# Patient Record
Sex: Male | Born: 2000 | Race: Black or African American | Hispanic: No | Marital: Single | Smoking: Never smoker
Health system: Southern US, Community
[De-identification: ages and names within clinical notes are randomized; demographics above are authoritative.]

## PROBLEM LIST (undated history)

## (undated) ENCOUNTER — Emergency Department (HOSPITAL_COMMUNITY): Discharge status: Against Medical Advice | Payer: 59

---
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
## 2020-05-14 ENCOUNTER — Ambulatory Visit (HOSPITAL_COMMUNITY)
Admission: EM | Admit: 2020-05-14 | Discharge: 2020-05-14 | Disposition: A | Discharge status: Home / Self Care | Payer: 59 | Attending: Family Medicine | Admitting: Family Medicine

## 2020-05-14 ENCOUNTER — Other Ambulatory Visit: Payer: Self-pay

## 2020-05-14 ENCOUNTER — Encounter (HOSPITAL_COMMUNITY): Payer: Self-pay | Admitting: *Deleted

## 2020-05-14 ENCOUNTER — Ambulatory Visit (INDEPENDENT_AMBULATORY_CARE_PROVIDER_SITE_OTHER): Payer: 59

## 2020-05-14 DIAGNOSIS — Y9367 Activity, basketball: Secondary | ICD-10-CM | POA: Diagnosis not present

## 2020-05-14 DIAGNOSIS — S93492A Sprain of other ligament of left ankle, initial encounter: Secondary | ICD-10-CM

## 2020-05-14 MED ORDER — IBUPROFEN 800 MG PO TABS: 800.0000 mg | ORAL_TABLET | Freq: Three times a day (TID) | ORAL | 0 refills | Status: AC

## 2020-05-14 NOTE — ED Provider Notes (Signed)
[facility]    CSN: [id number] Arrival date & time: 05/14/20  1330      History   Chief Complaint Chief Complaint  Patient presents with  . Ankle Injury    HPI Kenneth Stevens is a 19 y.o. male.   HPI   Twisted ankle playing basketball today Cannot bear weight Pain and swelling outside of ankle Never had fracture   History reviewed. No pertinent past medical history.  There are no problems to display for this patient.   History reviewed. No pertinent surgical history.     Home Medications    Prior to Admission medications   Medication Sig Start Date End Date Taking? Authorizing Provider  ibuprofen (ADVIL) 800 MG tablet Take 1 tablet (800 mg total) by mouth 3 (three) times daily. 05/14/20   Eustace Moore, MD  cetirizine (ZYRTEC) 10 MG tablet Take 1 tablet (10 mg total) by mouth daily. 12/28/12 05/14/20  Laurell Josephs, MD    Family History Family History  Problem Relation Age of Onset  . Anemia Mother     Social History Social History   Tobacco Use  . Smoking status: Never Smoker  . Smokeless tobacco: Never Used  Vaping Use  . Vaping Use: Every day  Substance Use Topics  . Alcohol use: No  . Drug use: Yes    Types: Marijuana    Comment: daily     Allergies   Patient has no known allergies.   Review of Systems Review of Systems See HPI  Physical Exam Triage Vital Signs ED Triage Vitals [05/14/20 1553]  Enc Vitals Group     BP 130/61     Pulse Rate 65     Resp 16     Temp 98.9 F (37.2 C)     Temp Source Oral     SpO2 100 %     Weight      Height      Head Circumference      Peak Flow      Pain Score 5     Pain Loc      Pain Edu?      Excl. in GC?    No data found.  Updated Vital Signs BP 130/61 (BP Location: Right Arm)   Pulse 65   Temp 98.9 F (37.2 C) (Oral)   Resp 16   SpO2 100%      Physical Exam Constitutional:      General: He is not in acute distress.    Appearance: He is well-developed.    HENT:     Head: Normocephalic and atraumatic.     Mouth/Throat:     Comments: Mask in place Eyes:     Conjunctiva/sclera: Conjunctivae normal.     Pupils: Pupils are equal, round, and reactive to light.  Cardiovascular:     Rate and Rhythm: Normal rate.  Pulmonary:     Effort: Pulmonary effort is normal. No respiratory distress.  Abdominal:     Palpations: Abdomen is soft.  Musculoskeletal:     Cervical back: Normal range of motion.     Left ankle: Swelling and ecchymosis present. No deformity or lacerations. Tenderness present over the lateral malleolus. No medial malleolus, base of 5th metatarsal or proximal fibula tenderness. Decreased range of motion. Anterior drawer test negative.     Left Achilles Tendon: Normal. No tenderness.  Skin:    General: Skin is warm and dry.  Neurological:     Mental Status: He is alert.  Gait: Gait abnormal.  Psychiatric:        Mood and Affect: Mood normal.        Behavior: Behavior normal.      UC Treatments / Results  Labs (all labs ordered are listed, but only abnormal results are displayed) Labs Reviewed - No data to display  EKG   Radiology DG Ankle Complete Left  Result Date: 05/14/2020 CLINICAL DATA:  Lateral ankle pain following basketball injury, initial encounter EXAM: LEFT ANKLE COMPLETE - 3+ VIEW COMPARISON:  None. FINDINGS: Lateral soft tissue swelling is noted. No acute fracture or dislocation is seen. IMPRESSION: Lateral soft tissue swelling without acute bony abnormality. Electronically Signed   By: Alcide Clever M.D.   On: 05/14/2020 16:35    Procedures Procedures (including critical care time)  Medications Ordered in UC Medications - No data to display  Initial Impression / Assessment and Plan / UC Course  I have reviewed the triage vital signs and the nursing notes.  Pertinent labs & imaging results that were available during my care of the patient were reviewed by me and considered in my medical decision  making (see chart for details).    Final Clinical Impressions(s) / UC Diagnoses   Final diagnoses:  Sprain of anterior talofibular ligament of left ankle, initial encounter     Discharge Instructions     Use ice for 20 minutes every couple of hours Limit walking well ankle is painful Wear brace until you can walk without a limp Brace is designed to be worn inside a lace up shoe Take ibuprofen 3 times a day with food as needed for pain Call sports medicine if you fail to improve as expected   ED Prescriptions    Medication Sig Dispense Auth. Provider   ibuprofen (ADVIL) 800 MG tablet Take 1 tablet (800 mg total) by mouth 3 (three) times daily. 21 tablet Eustace Moore, MD     PDMP not reviewed this encounter.   Eustace Moore, MD 05/14/20 [phone]

## 2020-05-14 NOTE — ED Triage Notes (Signed)
Patient in with complaints of left ankle injury/ pain that occurred today while playing basketball. Patient unable to bear weight on foot.Decreased ROM and swelling noted to left ankle.2- Ibuprofen taken around 1520.

## 2020-05-14 NOTE — Discharge Instructions (Signed)
Use ice for 20 minutes every couple of hours Limit walking well ankle is painful Wear brace until you can walk without a limp Brace is designed to be worn inside a lace up shoe Take ibuprofen 3 times a day with food as needed for pain Call sports medicine if you fail to improve as expected

## 2020-05-23 ENCOUNTER — Other Ambulatory Visit: Payer: Self-pay

## 2020-05-23 ENCOUNTER — Ambulatory Visit (INDEPENDENT_AMBULATORY_CARE_PROVIDER_SITE_OTHER): Payer: 59 | Admitting: Family Medicine

## 2020-05-23 DIAGNOSIS — S93409A Sprain of unspecified ligament of unspecified ankle, initial encounter: Secondary | ICD-10-CM | POA: Insufficient documentation

## 2020-05-23 DIAGNOSIS — S93492A Sprain of other ligament of left ankle, initial encounter: Secondary | ICD-10-CM

## 2020-05-23 NOTE — Assessment & Plan Note (Addendum)
Given history, clinical presentation, and exam today and previous x-ray results reviewed by myself today he has a grade 2 ankle sprain.  Given that he has continued to have pain in the ASO brace and needed the crutches I will place him in a short boot for 1 week so he can get around and do his job more easily without needing the crutches.  After 1 week I did instruct patient to transition back into the ASO brace.  I do want him coming out of the boot to do the range of motion exercises that we gave to him today.  Over the next week I want him to begin the strengthening exercises and do those for 1 week and always wear the ASO brace when active. -Continue ibuprofen 800 mg as needed for pain -Continue in the short boot for 1 week then transition to ASO brace -Begin range of motion exercises for 1 week then transition to strengthening exercises for 1 week -Follow-up in 2 weeks -Gave patient 2-week limit restriction on picking up items over 25 pounds and also do not want him bending down to pick up anything below the waist.  After 2 weeks he should be well-healed enough for these restrictions to be lifted.

## 2020-05-23 NOTE — Progress Notes (Addendum)
    SUBJECTIVE:   CHIEF COMPLAINT / HPI:   Left ankle pain [name] is a very pleasant 19 year old male who presents as a new patient to this clinic for follow-up due to lateral ankle pain.  He states about 9 days ago he was playing basketball and felt his ankle roll (inversion) and the top of his foot touch the ground.  He had immediate pain and swelling and was unable to bear weight.  He was seen at [facility] urgent care where he got x-rays which were negative for fracture and was treated for a ankle sprain and given an ASO brace and crutches.  He states since that time his swelling and pain has improved a good deal however he still has considerable amount of pain if he tries to bear weight without the crutches.  He has been taking the 800 mg ibuprofen and that is helping.  PERTINENT  PMH / PSH: None  OBJECTIVE:   BP 122/70   Ht 5\' 11"  (1.803 m)   Wt 165 lb (74.8 kg)   BMI 23.01 kg/m   No flowsheet data found. Ankle/Foot, Left: No visible erythema, mild swelling, no ecchymosis or bony deformity. He has mild swelling at the lateral malleolus and TTP below the lateral malleolus and just distal to the medial malleolus. No notable pes planus deformity. Transverse arch grossly intact; Range of motion is somewhat limited in all directions. Strength is 5/5 in all directions. Unremarkable squeeze test; Able to walk 4 steps but painful.  Special Tests:   - Anterior Drawer test: Positive   - Talar Tilt test: Equivocal   - Syndesmotic Squeeze test: NEG   ASSESSMENT/PLAN:   Sprain of ankle Given history, clinical presentation, and exam today and previous x-ray results reviewed by myself today he has a grade 2 ankle sprain.  Given that he has continued to have pain in the ASO brace and needed the crutches I will place him in a short boot for 1 week so he can get around and do his job more easily without needing the crutches.  After 1 week I did instruct patient to transition back into the ASO  brace.  I do want him coming out of the boot to do the range of motion exercises that we gave to him today.  Over the next week I want him to begin the strengthening exercises and do those for 1 week and always wear the ASO brace when active. -Continue ibuprofen 800 mg as needed for pain -Continue in the short boot for 1 week then transition to ASO brace -Begin range of motion exercises for 1 week then transition to strengthening exercises for 1 week -Follow-up in 2 weeks -Gave patient 2-week limit restriction on picking up items over 25 pounds and also do not want him bending down to pick up anything below the waist.  After 2 weeks he should be well-healed enough for these restrictions to be lifted.    , DO PGY-4, Sports Medicine Fellow [facility]  Addendum:  Patient seen in the office by fellow.  His history, exam, plan of care were precepted with me.  [facility] MD Norton Blizzard

## 2020-05-23 NOTE — Patient Instructions (Signed)
It was great to meet you today! Thank you for letting me participate in your care!  Today, we discussed you have a lateral ankle sprain. I want you to wear the boot for one week only, then go back to the ankle brace. Come out of the boot during the day to do the range of motion exercises over the next week. Don't wear the boot at night. After one week, star the strengthening ankle exercises and then follow up with [place] in clinic.  Be well, Jules Schick, DO PGY-4, Sports Medicine Fellow [facility]

## 2020-05-24 ENCOUNTER — Encounter: Payer: Self-pay | Admitting: Family Medicine

## 2020-06-03 ENCOUNTER — Other Ambulatory Visit: Payer: Self-pay

## 2020-06-03 ENCOUNTER — Emergency Department (HOSPITAL_COMMUNITY)
Admission: EM | Admit: 2020-06-03 | Discharge: 2020-06-04 | Disposition: A | Discharge status: Home / Self Care | Payer: 59 | Attending: Emergency Medicine | Admitting: Emergency Medicine

## 2020-06-03 ENCOUNTER — Encounter (HOSPITAL_COMMUNITY): Payer: Self-pay | Admitting: Emergency Medicine

## 2020-06-03 DIAGNOSIS — R1110 Vomiting, unspecified: Secondary | ICD-10-CM | POA: Diagnosis not present

## 2020-06-03 DIAGNOSIS — Y33XXXA Other specified events, undetermined intent, initial encounter: Secondary | ICD-10-CM | POA: Insufficient documentation

## 2020-06-03 DIAGNOSIS — Z03821 Encounter for observation for suspected ingested foreign body ruled out: Secondary | ICD-10-CM | POA: Diagnosis not present

## 2020-06-03 DIAGNOSIS — T189XXA Foreign body of alimentary tract, part unspecified, initial encounter: Secondary | ICD-10-CM

## 2020-06-03 DIAGNOSIS — R059 Cough, unspecified: Secondary | ICD-10-CM | POA: Diagnosis not present

## 2020-06-03 NOTE — ED Triage Notes (Signed)
Pt was smoking some marijuana and got the munchies. States that he was eating some [employer] and accidentally ate some of the plastic wrap and states that it is stuck in his throat. Tried to eat some bread to push it down, but it still feels like its there.

## 2020-06-04 ENCOUNTER — Other Ambulatory Visit: Payer: Self-pay

## 2020-06-04 NOTE — Discharge Instructions (Signed)
Thank you for allowing me to care for you today in the Emergency Department.   As we discussed, since he did not have any episodes of coughing, vomiting, or choking at the time when you swallowed the piece of plastic wrapper, it is extremely unlikely that the plastic wrapper was aspirated into your lungs.  As we discussed, this piece of plastic should travel through your digestive tract.  It should come out on its own.  There is no treatment for this at this time.  I would avoid trying to make yourself vomit as this may just make you feel unwell.  There is an extremely small chance that the piece of rubber could get stuck, but this is very unlikely.  However, if you were to develop severe, uncontrollable pain, if your abdomen became hard and swollen, if you stop passing gas, if you develop uncontrollable vomiting, or fevers, you should return to the emergency department for reevaluation.

## 2020-06-04 NOTE — ED Provider Notes (Signed)
[facility]-EMERGENCY DEPT Provider Note   CSN: [id number] Arrival date & time: 06/03/20  2157     History No chief complaint on file.   Kenneth Stevens is a 19 y.o. male who presents to the emergency department with a chief complaint of foreign body ingestion.  The patient was smoking marijuana earlier tonight and became hungry so he unwrapped a little Debbie snack cake.  He then unintentionally swallowed a small amount of the plastic wrapper along with the snack.  He states that he felt it was stuck in his throat.  He called his mom who told him to try and eat some bread to push it down.  There was some initial improvement, but he still feels as if he has a foreign body in his throat.  He did not try to cough really hard several times to the point that he ended up retching and vomiting approximately 5 times.  He has no nausea and has had no vomiting other than associated with the coughing episode.  He denies abdominal pain, cough, shortness of breath, dysphagia, sore throat, diarrhea, constipation.  He is passing flatus.  He has no other complaints at this time.  The history is provided by the patient and medical records. No language interpreter was used.       History reviewed. No pertinent past medical history.  Patient Active Problem List   Diagnosis Date Noted  . Sprain of ankle 05/23/2020    History reviewed. No pertinent surgical history.     Family History  Problem Relation Age of Onset  . Anemia Mother     Social History   Tobacco Use  . Smoking status: Never Smoker  . Smokeless tobacco: Never Used  Vaping Use  . Vaping Use: Every day  Substance Use Topics  . Alcohol use: No  . Drug use: Yes    Types: Marijuana    Comment: daily    Home Medications Prior to Admission medications   Medication Sig Start Date End Date Taking? Authorizing Provider  ibuprofen (ADVIL) 800 MG tablet Take 1 tablet (800 mg total) by mouth 3 (three) times daily.  05/14/20   Eustace Moore, MD  cetirizine (ZYRTEC) 10 MG tablet Take 1 tablet (10 mg total) by mouth daily. 12/28/12 05/14/20  Laurell Josephs, MD    Allergies    Patient has no known allergies.  Review of Systems   Review of Systems  Constitutional: Negative for appetite change, chills, diaphoresis and fever.  HENT: Positive for sore throat and trouble swallowing. Negative for congestion, sinus pressure, sinus pain and voice change.   Respiratory: Positive for cough. Negative for shortness of breath and wheezing.   Cardiovascular: Negative for chest pain and palpitations.  Gastrointestinal: Positive for abdominal pain, nausea and vomiting. Negative for constipation and diarrhea.  Genitourinary: Negative for dysuria.  Musculoskeletal: Negative for back pain, myalgias, neck pain and neck stiffness.  Skin: Negative for rash.  Allergic/Immunologic: Negative for immunocompromised state.  Neurological: Negative for dizziness, seizures, syncope, weakness, numbness and headaches.  Psychiatric/Behavioral: Negative for confusion.    Physical Exam Updated Vital Signs BP 102/77   Pulse (!) 58   Temp 99.6 F (37.6 C) (Oral)   Resp 17   SpO2 97%   Physical Exam Vitals and nursing note reviewed.  Constitutional:      General: He is not in acute distress.    Appearance: He is well-developed. He is not ill-appearing, toxic-appearing or diaphoretic.  HENT:  Head: Normocephalic.     Mouth/Throat:     Mouth: Mucous membranes are moist.     Pharynx: No oropharyngeal exudate or posterior oropharyngeal erythema.     Comments: Posterior oropharynx is patent.  Uvula is midline.  He is tolerating secretions without difficulty.  There is no trismus or drooling.  Normal phonation. Eyes:     Conjunctiva/sclera: Conjunctivae normal.  Cardiovascular:     Rate and Rhythm: Normal rate and regular rhythm.     Pulses: Normal pulses.     Heart sounds: Normal heart sounds. No murmur heard.  No  friction rub. No gallop.   Pulmonary:     Effort: Pulmonary effort is normal. No respiratory distress.     Breath sounds: No stridor. No wheezing, rhonchi or rales.  Chest:     Chest wall: No tenderness.  Abdominal:     General: There is no distension.     Palpations: Abdomen is soft.     Comments: Abdomen soft, nontender, nondistended.  Musculoskeletal:     Cervical back: Normal range of motion and neck supple. No rigidity or tenderness.     Right lower leg: No edema.     Left lower leg: No edema.  Skin:    General: Skin is warm and dry.  Neurological:     Mental Status: He is alert.  Psychiatric:        Behavior: Behavior normal.     ED Results / Procedures / Treatments   Labs (all labs ordered are listed, but only abnormal results are displayed) Labs Reviewed - No data to display  EKG None  Radiology No results found.  Procedures Procedures (including critical care time)  Medications Ordered in ED Medications - No data to display  ED Course  I have reviewed the triage vital signs and the nursing notes.  Pertinent labs & imaging results that were available during my care of the patient were reviewed by me and considered in my medical decision making (see chart for details).    MDM Rules/Calculators/A&P                          19 year old male with no significant past medical history who presents the emergency department with unintentional foreign body ingestion.  He swallowed a portion of a plastic wrapper on a snack cake earlier tonight.  He attempted to treat his symptoms by coughing so hard that he had 5 episodes of posttussive emesis.  He attempted swallowing some bread and drinking fluids.  He reports that his pain is resolved.  He no longer has globus sensation.  He is tolerating his secretions without difficulty.  Posterior oropharynx is patent.  Lungs are clear to auscultation bilaterally.  His abdomen is benign.  I have a very low suspicion that he  aspirated a portion of the plastic wrap given that he did not have any associated coughing, vomiting, or choking episodes at time of ingestion.  The patient is also not appear to have an esophageal obstruction at this time as all of his dysphagia, globus sensation, and sore throat have completely resolved.  I discussed with the patient that he will most likely passed the piece of plastic independently.  I also discussed that there is a very small chance that he could develop a bowel obstruction.  Signs and symptoms to return to the ER were also discussed.  All questions were answered.  He is hemodynamically stable to no acute distress.  Safe for discharge home with outpatient follow-up as indicated.  Final Clinical Impression(s) / ED Diagnoses Final diagnoses:  Swallowed foreign body, initial encounter    Rx / DC Orders ED Discharge Orders    None       Barkley Boards, PA-C 06/04/20 0134    Molpus, Jonny Ruiz, MD 06/04/20 [phone]

## 2020-06-06 ENCOUNTER — Ambulatory Visit (INDEPENDENT_AMBULATORY_CARE_PROVIDER_SITE_OTHER): Payer: 59 | Admitting: Family Medicine

## 2020-06-06 ENCOUNTER — Other Ambulatory Visit: Payer: Self-pay

## 2020-06-06 DIAGNOSIS — S93402D Sprain of unspecified ligament of left ankle, subsequent encounter: Secondary | ICD-10-CM

## 2020-06-06 NOTE — Progress Notes (Addendum)
    SUBJECTIVE:   CHIEF COMPLAINT / HPI:   Left ankle pain, follow-up Patient is a very pleasant 19 year old male presenting for follow-up with left ankle pain.  He rolled his ankle while playing basketball approximately 3-4 weeks ago sustaining inversion injury and was seen on the 20th for this.  Patient was also seen at the [facility] urgent care and got x-rays which were negative for fracture and he was treated for an ankle sprain.  Today patient states that his ankle pain is improved by about 70% since his previous visit.  He states that he continues to wear the ASO brace every day as he is on his feet constantly throughout the day and does ankle mobility exercises about once every other day.  He states that his swelling has improved but he still has a small amount of swelling compared to the other ankle.  He states that he feels stability in the ankle when he is walking but that sometimes he does have pain on the takeoff with steps in the area where his swelling remains, on the anterior medial aspect of the left ankle.  PERTINENT  PMH / PSH: None  OBJECTIVE:   BP 110/68   Ht 5\' 11"  (1.803 m)   Wt 170 lb (77.1 kg)   BMI 23.71 kg/m    Ankle/Foot, left: No visible erythema, only mild swelling compared to the right, ecchymosis, or bony deformity.  He does have some mild discomfort to the anterior lateral aspect of the left ankle with palpation.  No notable pes planus deformity. Transverse arch grossly intact; Range of motion is full in all directions. Strength is 5/5 in all directions.  On mild discomfort when strength testing is performed on the medial and lateral aspect. Mild + ant drawer.  ASSESSMENT/PLAN:   Sprain of ankle Assessment: 19 year old male following up for left ankle sprain which appears to be healing well at this point.  Patient endorses approximately 70% improvement in his symptoms.  Still endorses some mild swelling in the left ankle versus the right and continues to wear his  ASO brace while on his feet throughout the day, but he states that his pain has significantly improved.  Physical exam overall reassuring with good mobility and strength testing in all directions.  Overall healing at an appropriate interval. Plan: -Recommend he continue using the ASO brace while on his feet -Discussed that he can use ibuprofen as needed if he has time where he is on his feet longer than usual and gets pain -Patient plans to continue the band strengthening exercises on a daily basis as well as the mobility exercises for his ankle -Patient plans to follow-up as needed, such as if he develops a plateau in his healing or has worsening symptoms/pain.   24, DO PGY-2, [facility] Resident [facility]  [facility] Sports Medicine Fellow  Addendum:  Patient seen in the office by fellow and resident.  Their history, exam, plan of care were precepted with me.  Jules Schick MD [facility]

## 2020-06-06 NOTE — Assessment & Plan Note (Addendum)
Assessment: 19 year old male following up for left ankle sprain which appears to be healing well at this point.  Patient endorses approximately 70% improvement in his standards.  Still endorses some mild swelling in the left ankle versus the right and continues to wear his ASO brace while on his feet throughout the day, but he states that his pain has significantly improved.  Physical exam overall reassuring with good mobility and strength testing in all directions.  Overall healing at an appropriate interval. Plan: -Recommend he continue using the ASO brace while on his feet -Discussed that he can use ibuprofen as needed if he has time where he is on his feet longer than usual and gets pain -Patient plans to continue the band strengthening exercises on a daily basis as well as the mobility exercises for his ankle -Patient plans to follow-up as needed, such as if he develops a plateau in his healing or has worsening symptoms/pain.

## 2020-06-06 NOTE — Patient Instructions (Signed)
It was great to see you!  Our plans for today:  -Today we discussed your ankle sprain.  I am pleased with the improvement you have had at this point. -I would like for you to continue wearing your ASO brace while you are on your feet -I would like for you to continue with band work to build strengthen the muscles around your ankle -I would like for you to continue with the A, B, C mobility exercises at least once per day  -If needed you can do ibuprofen for pain on days where you are on your feet more than usual. -Follow-up with [place] as needed  Take care and seek immediate care sooner if you develop any concerns.   Dr. [facility]

## 2022-04-29 IMAGING — DX DG ANKLE COMPLETE 3+V*L*
3 series · 3 of 3 positions shown · non-contrast
Comparison: None.

CLINICAL DATA: Lateral ankle pain following basketball injury,
initial encounter

EXAM:
LEFT ANKLE COMPLETE - 3+ VIEW

[ankle ap]
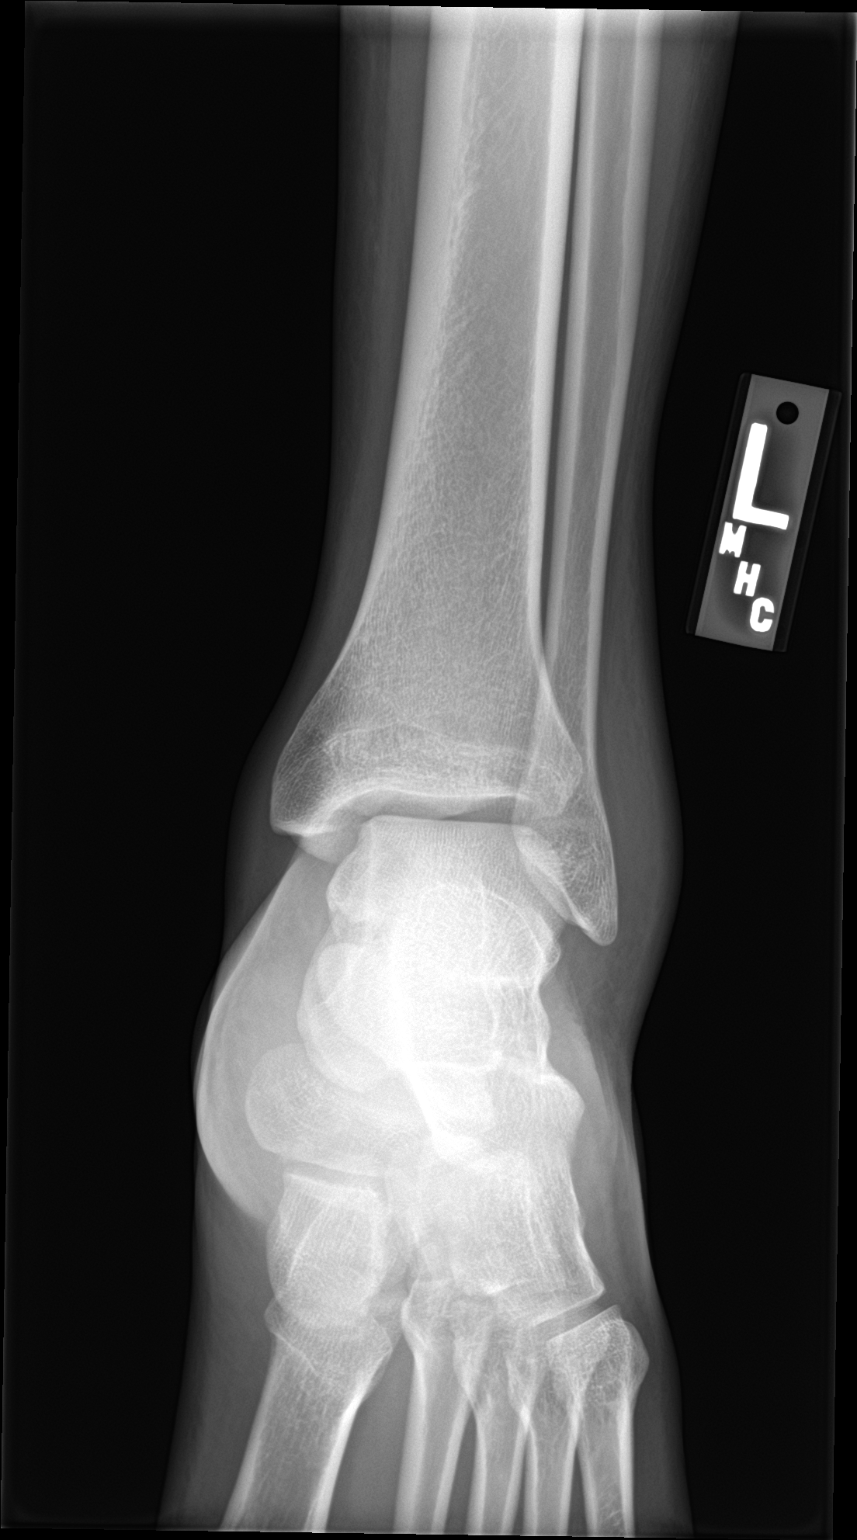

[ankle obl]
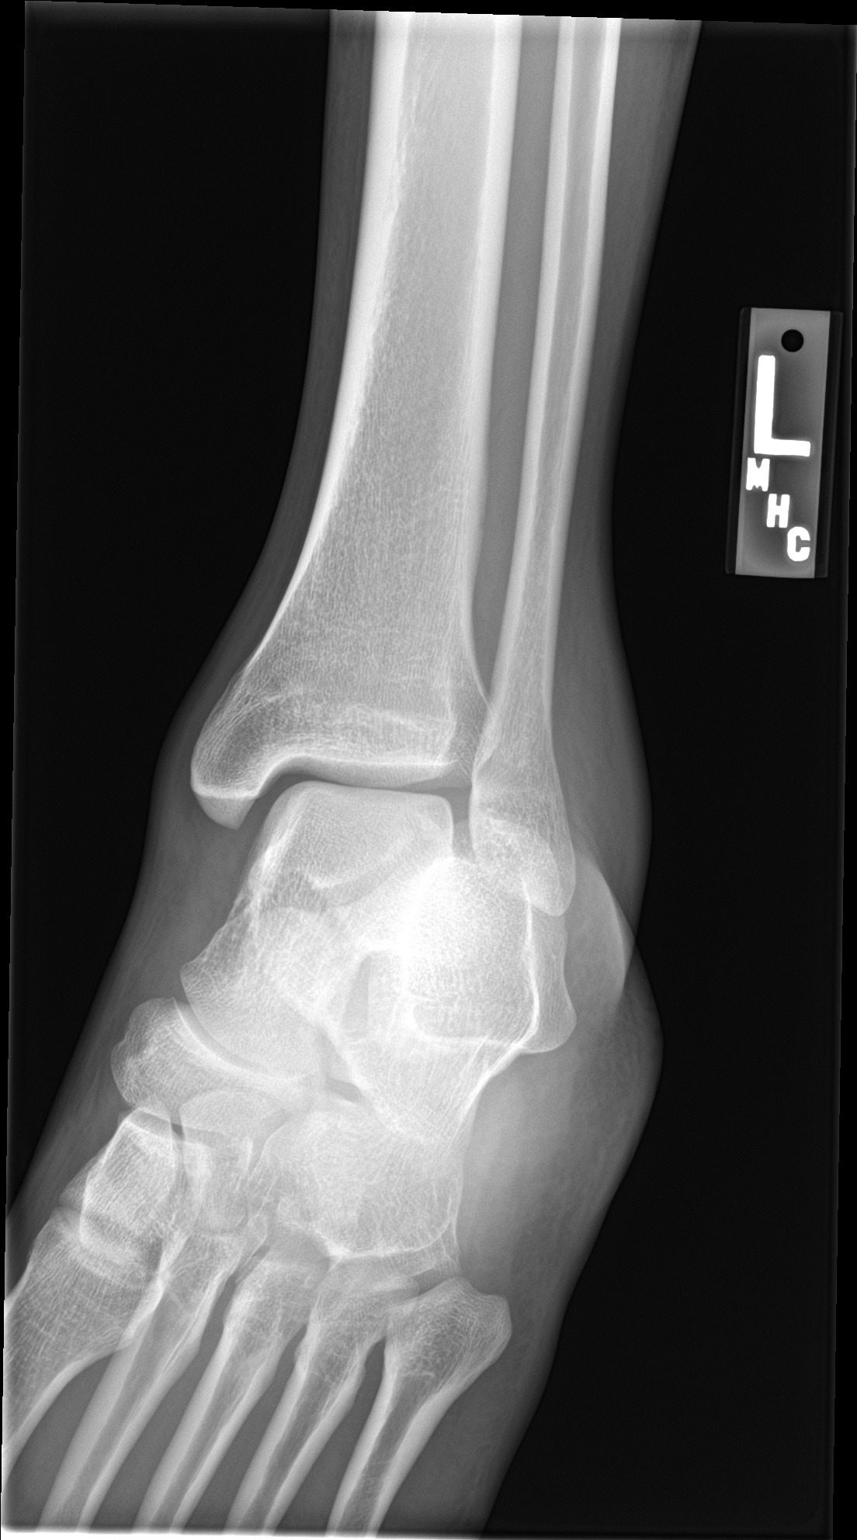

[ankle lat]
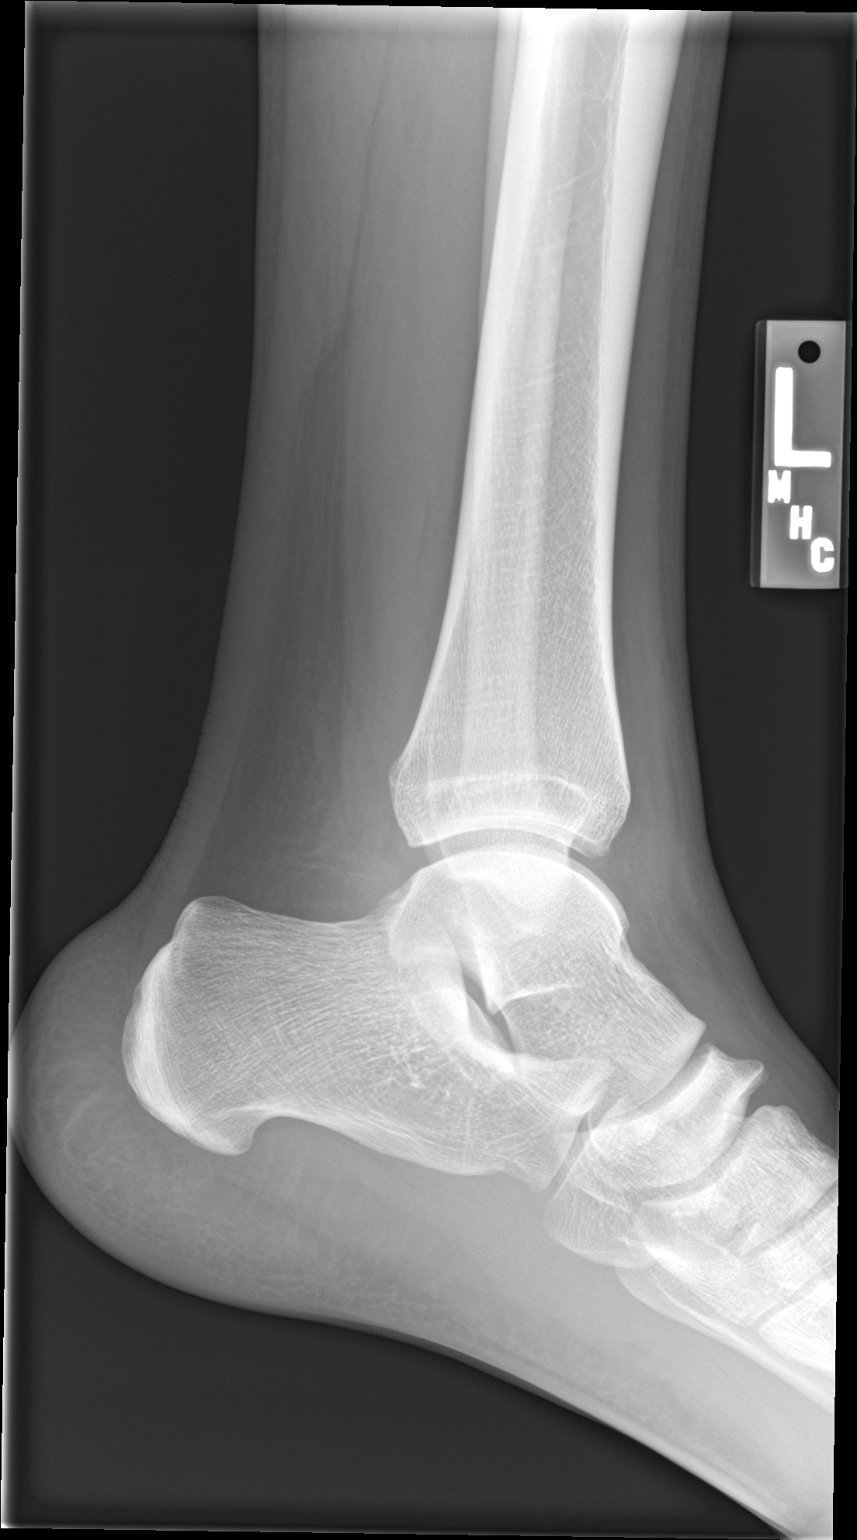

[3 of 3 positions shown; findings below may reference images not displayed]

FINDINGS: Lateral soft tissue swelling is noted. No acute fracture or
dislocation is seen.
IMPRESSION: Lateral soft tissue swelling without acute bony abnormality.
